# Patient Record
Sex: Male | Born: 1975 | Race: White | Hispanic: No | Marital: Married | Smoking: Never smoker
Health system: Southern US, Community
[De-identification: ages and names within clinical notes are randomized; demographics above are authoritative.]

## PROBLEM LIST (undated history)

## (undated) DIAGNOSIS — L564 Polymorphous light eruption: Secondary | ICD-10-CM

---

## 2019-01-14 ENCOUNTER — Ambulatory Visit
Admission: RE | Admit: 2019-01-14 | Discharge: 2019-01-14 | Disposition: A | Discharge status: Home / Self Care | Payer: BC Managed Care – PPO | Source: Ambulatory Visit | Attending: Family Medicine | Admitting: Family Medicine

## 2019-01-14 ENCOUNTER — Other Ambulatory Visit: Payer: Self-pay | Admitting: Family Medicine

## 2019-01-14 ENCOUNTER — Other Ambulatory Visit: Payer: Self-pay

## 2019-01-14 DIAGNOSIS — M79675 Pain in left toe(s): Secondary | ICD-10-CM | POA: Insufficient documentation

## 2019-03-16 ENCOUNTER — Other Ambulatory Visit: Payer: Self-pay

## 2019-03-16 DIAGNOSIS — Z20822 Contact with and (suspected) exposure to covid-19: Secondary | ICD-10-CM

## 2019-03-17 LAB — NOVEL CORONAVIRUS, NAA: SARS-CoV-2, NAA: NOT DETECTED

## 2020-07-01 IMAGING — CR LEFT FOOT - COMPLETE 3+ VIEW
1 series · 3 of 3 positions shown · non-contrast
Comparison: None.

CLINICAL DATA: Stubbed first toe 2 weeks ago with persistent pain
and swelling, initial encounter

EXAM:
LEFT FOOT - COMPLETE 3+ VIEW

[Series 1: dg foot complete left · 0.14mm/px · 3 of 3 slices shown]
[im 1/3]
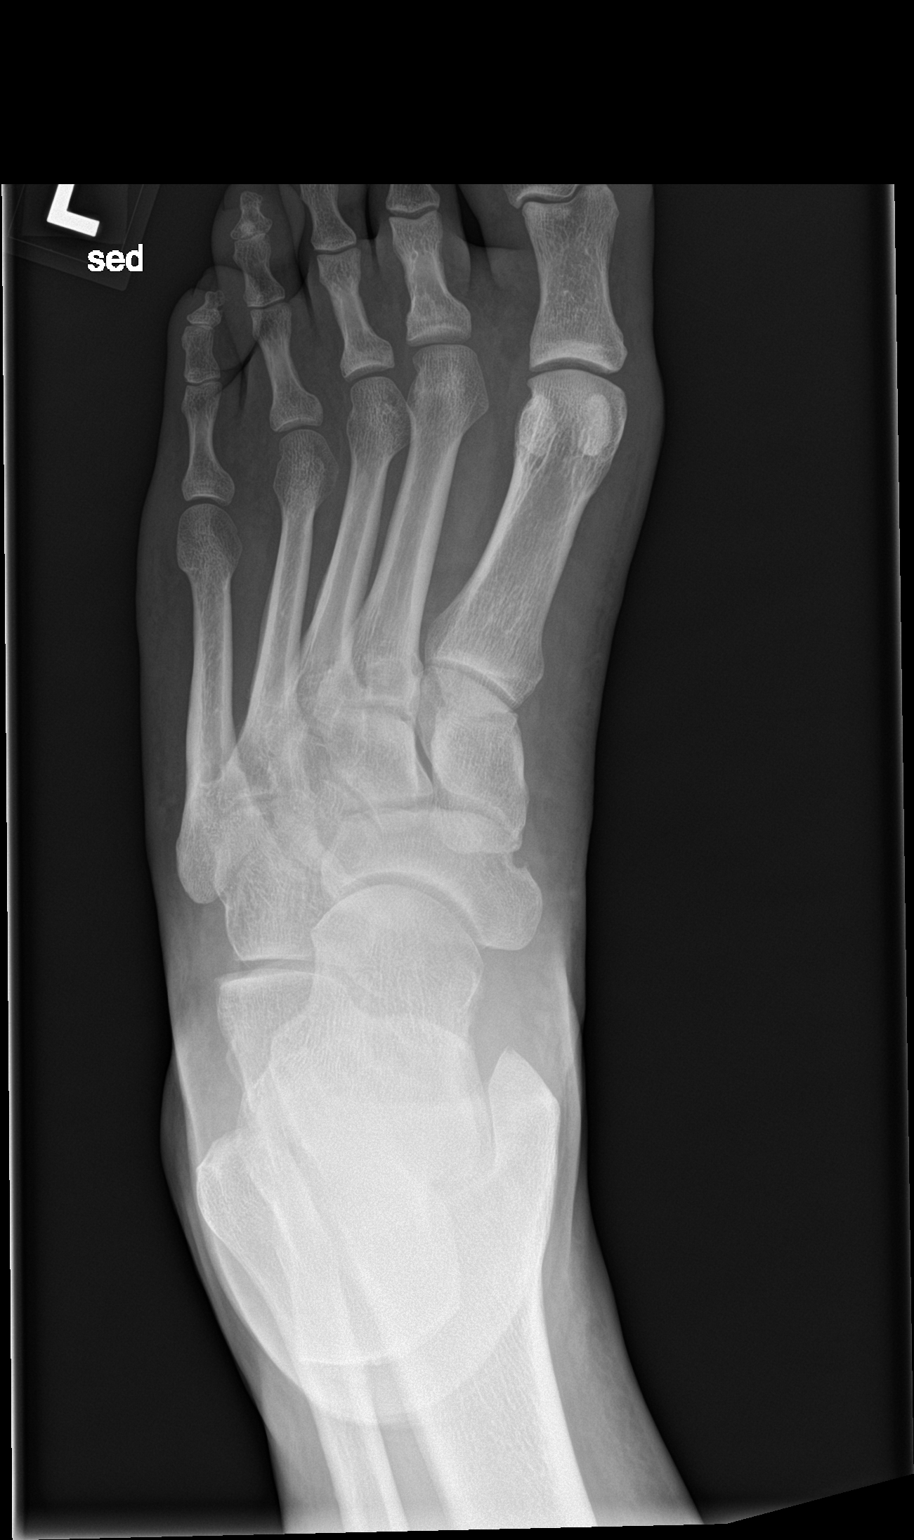
[im 2/3]
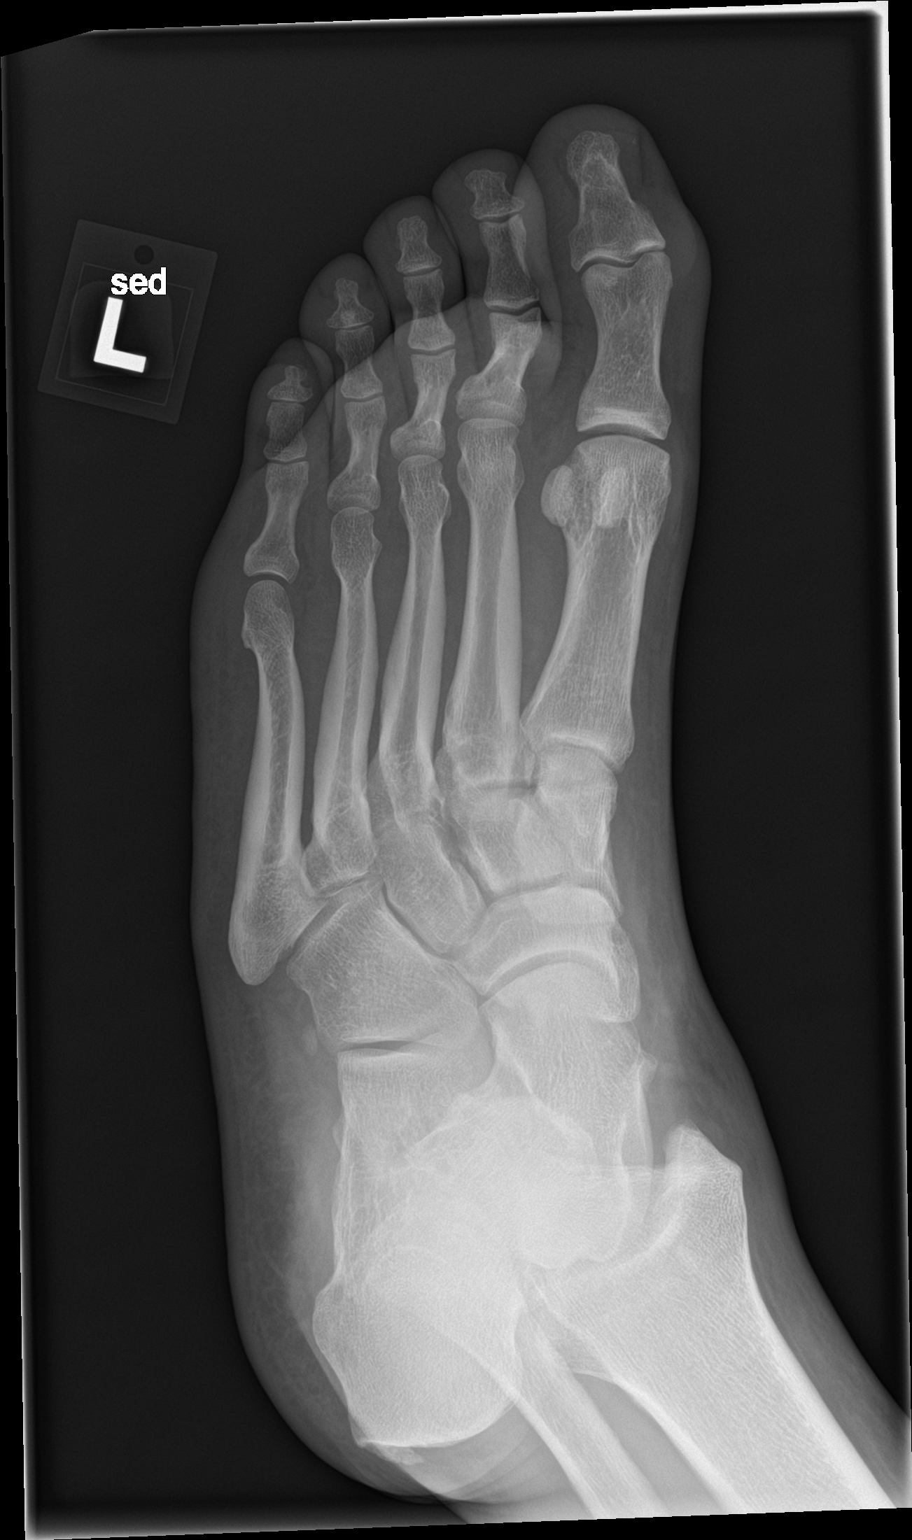
[im 3/3]
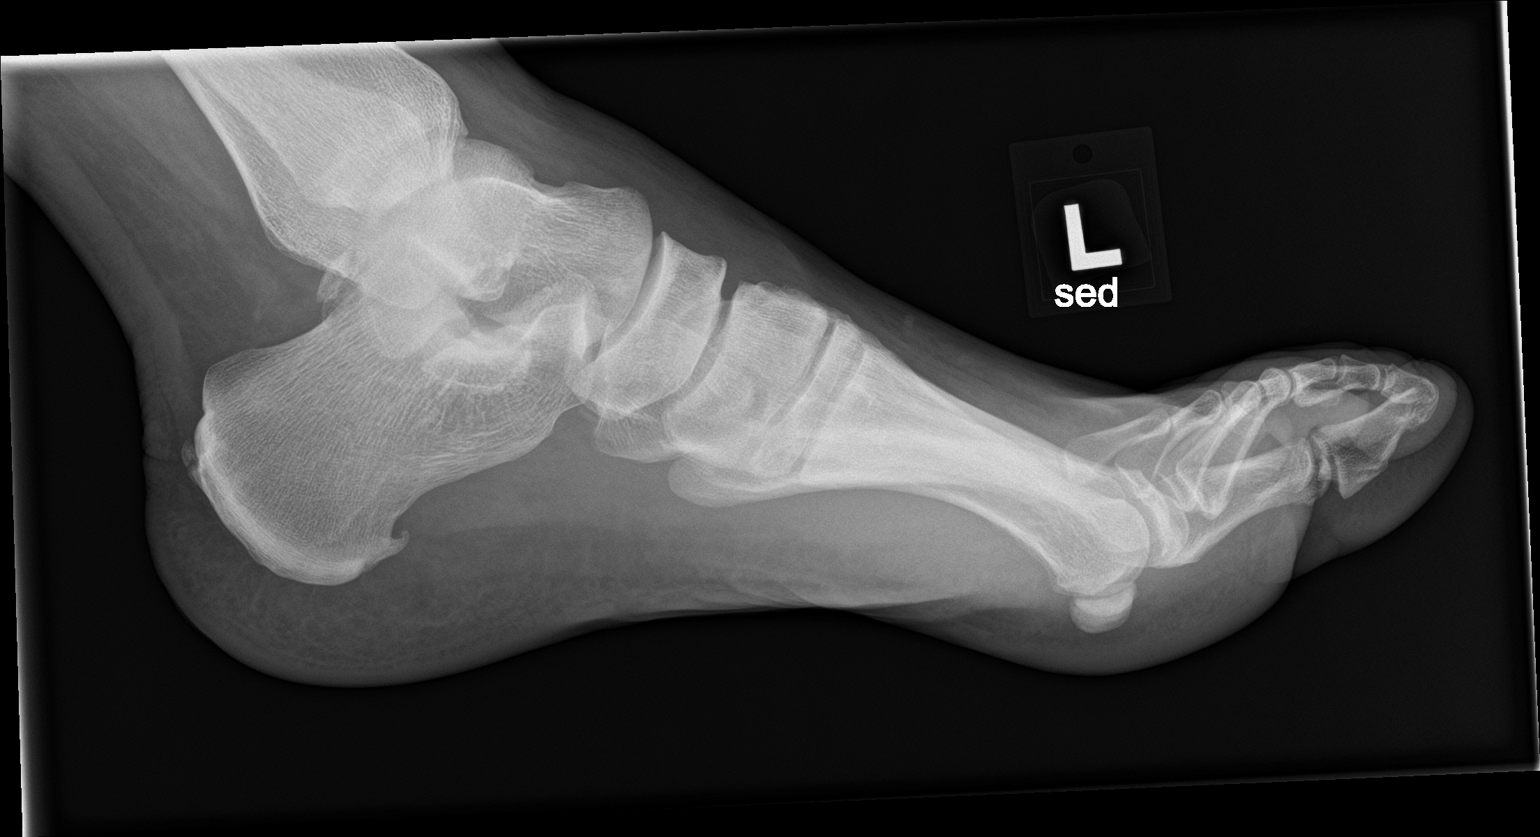

[3 of 3 positions shown; findings below may reference images not displayed]

FINDINGS: Calcaneal spurs are noted. No acute fracture or dislocation is seen.
No soft tissue abnormality is noted.
IMPRESSION: No acute abnormality seen.

## 2020-10-29 ENCOUNTER — Other Ambulatory Visit: Payer: Self-pay

## 2020-10-29 ENCOUNTER — Encounter: Payer: Self-pay | Admitting: Emergency Medicine

## 2020-10-29 ENCOUNTER — Emergency Department
Admission: EM | Admit: 2020-10-29 | Discharge: 2020-10-29 | Disposition: A | Discharge status: Home / Self Care | Payer: BC Managed Care – PPO | Attending: Emergency Medicine | Admitting: Emergency Medicine

## 2020-10-29 DIAGNOSIS — L509 Urticaria, unspecified: Secondary | ICD-10-CM | POA: Diagnosis not present

## 2020-10-29 DIAGNOSIS — T7840XA Allergy, unspecified, initial encounter: Secondary | ICD-10-CM | POA: Insufficient documentation

## 2020-10-29 HISTORY — DX: Polymorphous light eruption: L56.4

## 2020-10-29 MED ORDER — METHYLPREDNISOLONE SODIUM SUCC 125 MG IJ SOLR
125.0000 mg | Freq: Once | INTRAMUSCULAR | Status: AC
  Administered 2020-10-29: 125 mg via INTRAVENOUS
  Filled 2020-10-29: qty 2

## 2020-10-29 MED ORDER — PREDNISONE 50 MG PO TABS: 50.0000 mg | ORAL_TABLET | Freq: Every day | ORAL | 0 refills | Status: AC

## 2020-10-29 MED ORDER — FAMOTIDINE IN NACL 20-0.9 MG/50ML-% IV SOLN
20.0000 mg | Freq: Once | INTRAVENOUS | Status: AC
  Administered 2020-10-29: 20 mg via INTRAVENOUS
  Filled 2020-10-29: qty 50

## 2020-10-29 MED ORDER — DIPHENHYDRAMINE HCL 50 MG/ML IJ SOLN
25.0000 mg | INTRAMUSCULAR | Status: AC
  Administered 2020-10-29: 25 mg via INTRAVENOUS
  Filled 2020-10-29: qty 1

## 2020-10-29 NOTE — ED Notes (Signed)
Pt resting with eyes closed; easily aroused.  GCS 15.  RR even and unlabored on RA - no acute distress noted.  Pt reports itching and rash have improved since receiving meds after arrival.  Continues to deny airway compromise or other complaints.  Will continue to monitor for acute changes and maintain plan of care.

## 2020-10-29 NOTE — ED Notes (Signed)
ED Provider at bedside. 

## 2020-10-29 NOTE — ED Notes (Signed)
Rash appears faded back to appropriate skin color, pt reports itching mostly subsided

## 2020-10-29 NOTE — ED Notes (Signed)
Pt reports pruritus negligible

## 2020-10-29 NOTE — ED Triage Notes (Signed)
Pt with hives noted to abd, chest, arms. Pt is beginning to have rash noted to face. Pt with itching, no agioedema noted. No vomiting, shob or diarrhea. Pt took benadryl 50mg  po at 2200.

## 2020-10-29 NOTE — Discharge Instructions (Signed)
You have been seen in the Emergency Department (ED) today for an allergic reaction.  You have been stable throughout your stay in the Emergency Department.  Please take your medications as prescribed and follow up with your doctor as indicated.  You can continue to use Benadryl or a medication such as Zyrtec (cetirizine) as needed according to label instructions.  You might find the cetirizine is less likely to make you sleepy, and it only needs to be taken once a day or at most twice a day.  Please take the full 5-day course of prednisone as prescribed.  Return to the Emergency Department (ED) if you experience any worsening or new symptoms that concern you.

## 2020-10-29 NOTE — ED Notes (Signed)
Pt agreeable with d/c plan as discussed by provider Dr York Cerise.  This nurse has verbally reinforced d/c instructions and provided pt with written copy - pt acknowledges verbal understanding -denies any additional questions, concerns, needs

## 2020-10-29 NOTE — ED Provider Notes (Signed)
[facility] Emergency Department Provider Note  ____________________________________________   Event Date/Time   First MD Initiated Contact with Patient 10/29/20 0117     (approximate)  I have reviewed the triage vital signs and the nursing notes.   HISTORY  Chief Complaint Allergic Reaction    HPI Lance Pearson is a 45 y.o. male he denies any chronic medical issues other than "sun allergy" (polymorphous light eruption) who presents for evaluation of  about 24 hours of gradually worsening rash.  He said that it started on his arm and he assumes that he just came into contact with something that caused some hives.  It has gradually worsened over time and is now generalized over the majority of his body including his face and neck.  No oral involvement with no mouth lesions or tongue lesions.  No facial swelling, no difficulty breathing, no difficulty swallowing, no shortness of breath, no cough.  He also denies nausea, diarrhea, and abdominal pain.  However he was concerned because he took some Benadryl (50 mg by mouth) a few hours ago and the symptoms are still worsening.  He said that the rash is very itchy but not painful.  He has no recent new products, no new medications, and is unaware of any recent contact with any allergens that would cause this.  He has not spent more time outside in the sun than usual and in fact over the last couple of days he has been mostly inside.        Past Medical History:  Diagnosis Date  . Polymorphous light eruption     There are no problems to display for this patient.   History reviewed. No pertinent surgical history.  Prior to Admission medications   Medication Sig Start Date End Date Taking? Authorizing Provider  predniSONE (DELTASONE) 50 MG tablet Take 1 tablet (50 mg total) by mouth daily for 5 days. 10/29/20 11/03/20 Yes Loleta Rose, MD    Allergies Amoxicillin and Penicillins  History reviewed. No  pertinent family history.  Social History Social History   Tobacco Use  . Smoking status: Never Smoker  . Smokeless tobacco: Never Used    Review of Systems Constitutional: No fever/chills Eyes: No visual changes. ENT: No sore throat.  No difficulty swallowing. Cardiovascular: Denies chest pain. Respiratory: Denies shortness of breath. Gastrointestinal: No abdominal pain.  No nausea, no vomiting.  No diarrhea.  No constipation. Genitourinary: Negative for dysuria. Musculoskeletal: Negative for neck pain.  Negative for back pain. Integumentary: Generalized raised red and itching rash. Neurological: Negative for headaches, focal weakness or numbness.   ____________________________________________   PHYSICAL EXAM:  VITAL SIGNS: ED Triage Vitals [10/29/20 0110]  Enc Vitals Group     BP (!) 153/102     Pulse Rate 70     Resp 16     Temp 98 F (36.7 C)     Temp Source Oral     SpO2 100 %     Weight 122.5 kg (270 lb)     Height 1.854 m (6\' 1" )     Head Circumference      Peak Flow      Pain Score 0     Pain Loc      Pain Edu?      Excl. in GC?     Constitutional: Alert and oriented.  Eyes: Conjunctivae are normal.  Head: Atraumatic. Nose: No congestion/rhinnorhea. Mouth/Throat: Patient is wearing a mask.  No oral lesions on exam.  No  angioedema. Neck: No stridor.  No meningeal signs.   Cardiovascular: Normal rate, regular rhythm. Good peripheral circulation. Respiratory: Normal respiratory effort.  No retractions. Gastrointestinal: Soft and nontender. No distention.  Musculoskeletal: No lower extremity tenderness nor edema. No gross deformities of extremities. Neurologic:  Normal speech and language. No gross focal neurologic deficits are appreciated.  Skin:  Skin is warm, dry and intact.  He has a generalized urticarial rash over most of the skin surfaces including his face, and is worse on the arms but also present on his torso.  No involvement of the palms or  soles. Psychiatric: Mood and affect are normal. Speech and behavior are normal.  ____________________________________________   LABS (all labs ordered are listed, but only abnormal results are displayed)  Labs Reviewed - No data to display ____________________________________________   PROCEDURES   Procedure(s) performed (including Critical Care):  Procedures   ____________________________________________   INITIAL IMPRESSION / MDM / ASSESSMENT AND PLAN / ED COURSE  As part of my medical decision making, I reviewed the following data within the electronic MEDICAL RECORD NUMBER Nursing notes reviewed and incorporated, Old chart reviewed and Notes from prior ED visits  Differential diagnosis includes, but is not limited to, allergic reaction, anaphylaxis, medication or drug side effect.  Patient does not meet criteria for anaphylaxis and is in no distress.  However his urticaria has been worsening over time including after using Benadryl.  An IV has been placed and I am giving him Solu-Medrol 125 mg IV, famotidine 20 mg IV, and Benadryl 25 mg IV.  Will observe for a few hours to make sure he is not getting worse or developing clinical signs of anaphylaxis.  Patient understands and agrees with the plan.     Clinical Course as of 10/29/20 0502  Mon Oct 29, 2020  0244 Rash and itching are improving. [CF]  [phone] The patient feels much better and the hives have almost completely resolved.  He is comfortable with the plan for discharge.  Prescriptions as listed below.  I gave my usual and customary return precautions. [CF]    Clinical Course User Index [CF] Loleta Rose, MD     ____________________________________________  FINAL CLINICAL IMPRESSION(S) / ED DIAGNOSES  Final diagnoses:  Allergic reaction, initial encounter  Hives     MEDICATIONS GIVEN DURING THIS VISIT:  Medications  methylPREDNISolone sodium succinate (SOLU-MEDROL) 125 mg/2 mL injection 125 mg (125 mg  Intravenous Given 10/29/20 0140)  famotidine (PEPCID) IVPB 20 mg premix (0 mg Intravenous Stopped 10/29/20 0212)  diphenhydrAMINE (BENADRYL) injection 25 mg (25 mg Intravenous Given 10/29/20 0138)     ED Discharge Orders         Ordered    predniSONE (DELTASONE) 50 MG tablet  Daily        10/29/20 0500          *Please note:  Lance Pearson was evaluated in Emergency Department on 10/29/2020 for the symptoms described in the history of present illness. He was evaluated in the context of the global COVID-19 pandemic, which necessitated consideration that the patient might be at risk for infection with the SARS-CoV-2 virus that causes COVID-19. Institutional protocols and algorithms that pertain to the evaluation of patients at risk for COVID-19 are in a state of rapid change based on information released by regulatory bodies including the CDC and federal and state organizations. These policies and algorithms were followed during the patient's care in the ED.  Some ED evaluations and interventions may be delayed as  a result of limited staffing during and after the pandemic.*  Note:  This document was prepared using Dragon voice recognition software and may include unintentional dictation errors.   Loleta Rose, MD 10/29/20 [phone]

## 2020-10-29 NOTE — ED Notes (Addendum)
ED Provider at bedside.  Pt reports making potato soup for family at noon and approx 1600 itchy welts appeared on arms, legs and trunk, pt took PO benadryl 50 mg approx 1630, pt reports working outside on regular basis ([employer]) and only known allergy to PCN and reports amoxicillin "closed my throat"   Pt appears with raised red rash, itching, all over body especially on extremities of limbs, breathing without distress

## 2020-10-30 ENCOUNTER — Emergency Department
Admission: EM | Admit: 2020-10-30 | Discharge: 2020-10-30 | Disposition: A | Discharge status: Home / Self Care | Payer: BC Managed Care – PPO | Attending: Emergency Medicine | Admitting: Emergency Medicine

## 2020-10-30 DIAGNOSIS — L509 Urticaria, unspecified: Secondary | ICD-10-CM | POA: Insufficient documentation

## 2020-10-30 DIAGNOSIS — T782XXA Anaphylactic shock, unspecified, initial encounter: Secondary | ICD-10-CM

## 2020-10-30 DIAGNOSIS — T783XXA Angioneurotic edema, initial encounter: Secondary | ICD-10-CM

## 2020-10-30 MED ORDER — EPINEPHRINE 0.3 MG/0.3ML IJ SOAJ: 0.3000 mg | INTRAMUSCULAR | 1 refills | Status: AC | PRN

## 2020-10-30 NOTE — ED Notes (Signed)
Patient reports mild hives starting to appear to bilateral arms, MD aware. No new orders at this time.

## 2020-10-30 NOTE — ED Triage Notes (Signed)
Patient BIBA from home. Patient reports increasing hives and hoarseness at 1230 today. Patient reports he has had hives since Sunday at 4pm. Unknown allergent. Patient report he was in ER Sunday evening. Patient has been taking benadryl 4-6 hours at home. Seen at [facility] this morning. Patient given epi 0.3 IM by fire rescue and then 0.2 epi IM by EMS in route. Patient given 50mg  IV benadryl and 20 mg pepcid IV. Patient A&OX3. Patient voice noted to be hoarse.

## 2020-10-30 NOTE — ED Notes (Signed)
ED Provider at bedside. 

## 2020-10-30 NOTE — ED Provider Notes (Signed)
[facility] Emergency Department Provider Note   ____________________________________________   Event Date/Time   First MD Initiated Contact with Patient 10/30/20 1503     (approximate)  I have reviewed the triage vital signs and the nursing notes.   HISTORY  Chief Complaint Allergic Reaction    HPI Lance Pearson is a 45 y.o. 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  Patient states that he was seen approximately 3 days ago for hives and generalized itchiness.  Patient was treated at that time with Benadryl and steroids with improvement of his symptoms.  After that, patient states that he went to bed, woke up and had a recurrence of his symptoms at that time as well with hives and increased itching.  Patient states that he has been attempting to control the symptoms at home with Benadryl every 4-6 hours.  However, this morning when he woke up he began having hoarseness and the sensation of throat swelling.  Patient called EMS and was initially given 0.3 mg of epinephrine IM by fire rescue and an additional 0.2 mg of epinephrine IM by EMS upon their arrival.  EMS also gave patient 50 mg of IV Benadryl and 20 mg of IV Pepcid prior to his arrival.  Patient states that his sensation of throat swelling and hives have almost completely resolved at this time.  Patient denies ever having any palpitations, shortness of breath, nausea/vomiting, or abdominal pain         Past Medical History:  Diagnosis Date  . Polymorphous light eruption     There are no problems to display for this patient.   No past surgical history on file.  Prior to Admission medications   Medication Sig Start Date End Date Taking? Authorizing Provider  predniSONE (DELTASONE) 50 MG tablet Take 1 tablet (50 mg total) by mouth daily for 5 days. 10/29/20 11/03/20  Loleta Rose, MD     Allergies Amoxicillin and Penicillins  No family history on file.  Social History Social History   Tobacco Use  . Smoking status: Never Smoker  . Smokeless tobacco: Never Used    Review of Systems Constitutional: No fever/chills Eyes: No visual changes. ENT: Endorses throat swelling prior to epinephrine administration Cardiovascular: Denies chest pain. Respiratory: Denies shortness of breath. Gastrointestinal: No abdominal pain.  No nausea, no vomiting.  No diarrhea. Genitourinary: Negative for dysuria. Musculoskeletal: Negative for acute arthralgias Skin: Positive for rash. Neurological: Negative for headaches, weakness/numbness/paresthesias in any extremity Psychiatric: Negative for suicidal ideation/homicidal ideation   ____________________________________________   PHYSICAL EXAM:  VITAL SIGNS: ED Triage Vitals  Enc Vitals Group     BP 10/30/20 1504 (!) 129/110     Pulse Rate 10/30/20 1504 (!) 107     Resp 10/30/20 1504 18     Temp 10/30/20 1504 98 F (36.7 C)     Temp Source 10/30/20 1504 Oral     SpO2 10/30/20 1504 97 %     Weight 10/30/20 1505 268 lb 15.4 oz (122 kg)     Height 10/30/20 1505 6\' 1"  (1.854 m)     Head Circumference --      Peak Flow --      Pain Score 10/30/20 1504 0     Pain Loc --      Pain Edu? --      Excl. in GC? --    Constitutional: Alert and oriented. Well  appearing and in no acute distress. Eyes: Conjunctivae are normal. PERRL. Head: Atraumatic. Nose: No congestion/rhinnorhea. Mouth/Throat: Mucous membranes are moist.  Airway patent without any stridor Neck: No stridor Cardiovascular: Grossly normal heart sounds.  Good peripheral circulation. Respiratory: Normal respiratory effort.  No retractions. Gastrointestinal: Soft and nontender. No distention. Musculoskeletal: No obvious deformities Neurologic:  Normal speech and language. No gross focal neurologic deficits are appreciated. Skin:  Skin is warm and dry.  Mild  urticaria over trunk and extremities Psychiatric: Mood and affect are normal. Speech and behavior are normal.  ____________________________________________   LABS (all labs ordered are listed, but only abnormal results are displayed)  Labs Reviewed - No data to display   PROCEDURES  Procedure(s) performed (including Critical Care):  .1-3 Lead EKG Interpretation Performed by: Merwyn Katos, MD Authorized by: Merwyn Katos, MD     Interpretation: normal     ECG rate:  87   ECG rate assessment: normal     Rhythm: sinus rhythm     Ectopy: none     Conduction: normal       ____________________________________________   INITIAL IMPRESSION / ASSESSMENT AND PLAN / ED COURSE  As part of my medical decision making, I reviewed the following data within the electronic MEDICAL RECORD NUMBER Nursing notes reviewed and incorporated, Old chart reviewed, and Notes from prior ED visits reviewed and incorporated        + Cutaneous hives/erythema No evidence of multiorgan involvement  Given history and exam, presentation most consistent with allergic reaction. I have low suspicion for toxic shock syndrome, asthma exacerbation, or drug toxicity.  Patient did receive epinephrine in the field and therefore required a 4-hour observation  Reassessment: 2001 patient resting comfortably with continued decrease in urticaria and no hoarseness, stridor, or throat swelling.  The patient has been reexamined and is ready to be discharged.  All diagnostic results have been reviewed and discussed with the patient/family.  Care plan has been outlined and the patient/family understands all current diagnoses, results, and treatment plans.  There are no new complaints, changes, or physical findings at this time.  All questions have been addressed and answered.  All medications, if any, that were given while in the emergency department or any that are being prescribed have been reviewed with the patient/family.   All side effects and adverse reactions have been explained.  Patient was instructed to, and agrees to follow-up with their primary care physician as well as return to the emergency department if any new or worsening symptoms develop.  Rx: Prednisone 60mg  qday x3days, Benadryl 25mg  q8hr x3days.  As needed EpiPen Disposition: Discharge home with SRP. Follow up with PCP in 1-2 days.      ____________________________________________   FINAL CLINICAL IMPRESSION(S) / ED DIAGNOSES  Final diagnoses:  None     ED Discharge Orders    None       Note:  This document was prepared using Dragon voice recognition software and may include unintentional dictation errors.   , MD 10/30/20 2013

## 2022-12-12 ENCOUNTER — Ambulatory Visit: Payer: BC Managed Care – PPO | Admitting: Podiatry

## 2022-12-12 ENCOUNTER — Encounter: Payer: Self-pay | Admitting: Podiatry

## 2022-12-12 DIAGNOSIS — Q666 Other congenital valgus deformities of feet: Secondary | ICD-10-CM | POA: Diagnosis not present

## 2022-12-12 NOTE — Progress Notes (Signed)
  Subjective:  Patient ID: Lance Pearson, male    DOB: [birth year],  MRN: [id number]  Chief Complaint  Patient presents with   Foot Pain    47 y.o. male presents with the above complaint.  Patient presents with pes planovalgus deformity.  Patient states that he is having some foot pain due to the arches and heel.  He does do a lot on his foot.  He wanted to get it evaluated he denies seeing anyone as prior to seeing me denies any other acute complaints he does not wear any orthotics he was okay shoes.   Review of Systems: Negative except as noted in the HPI. Denies N/V/F/Ch.  Past Medical History:  Diagnosis Date   Polymorphous light eruption     Current Outpatient Medications:    colchicine 0.6 MG tablet, Take 1 tablet by mouth 2 (two) times daily., Disp: , Rfl:    levocetirizine (XYZAL) 5 MG tablet, 1 tablet in the evening Orally Once a day for 30 day(s), Disp: , Rfl:    diclofenac (CATAFLAM) 50 MG tablet, , Disp: , Rfl:    EPINEPHrine 0.3 mg/0.3 mL IJ SOAJ injection, Inject 0.3 mg into the muscle as needed for anaphylaxis., Disp: 3 each, Rfl: 1   rosuvastatin (CRESTOR) 5 MG tablet, TAKE 1 TABLET BY MOUTH EVERY DAY for 90 days, Disp: , Rfl:   Social History   Tobacco Use  Smoking Status Never  Smokeless Tobacco Never    Allergies  Allergen Reactions   Amoxicillin Swelling   Penicillins    Objective:  There were no vitals filed for this visit. There is no height or weight on file to calculate BMI. Constitutional Well developed. Well nourished.  Vascular Dorsalis pedis pulses palpable bilaterally. Posterior tibial pulses palpable bilaterally. Capillary refill normal to all digits.  No cyanosis or clubbing noted. Pedal hair growth normal.  Neurologic Normal speech. Oriented to person, place, and time. Epicritic sensation to light touch grossly present bilaterally.  Dermatologic Nails well groomed and normal in appearance. No open wounds. No skin lesions.  Orthopedic:  Gait examination shows pes planovalgus deformity calcaneovalgus to many toe signs partially recruit the arch with dorsiflexion of the hallux unable to perform single and double heel raise   Radiographs: None Assessment:   1. Pes planovalgus    Plan:  Patient was evaluated and treated and all questions answered.  Bilateral pes planovalgus -Pes planovalgus -I explained to patient the etiology of pes planovalgus and relationship with Planter fasciitis and various treatment options were discussed.  Given patient foot structure in the setting of Planter fasciitis I believe patient will benefit from custom-made orthotics to help control the hindfoot motion support the arch of the foot and take the stress away from plantar fascial.  Patient agrees with the plan like to proceed with orthotics -Patient was casted for orthotics   No follow-ups on file.

## 2023-01-16 ENCOUNTER — Telehealth: Payer: Self-pay | Admitting: Podiatry

## 2023-01-16 NOTE — Telephone Encounter (Signed)
Pt checking to see if his orthotics are ready for pick up.

## 2023-02-11 ENCOUNTER — Ambulatory Visit (INDEPENDENT_AMBULATORY_CARE_PROVIDER_SITE_OTHER): Payer: BC Managed Care – PPO | Admitting: Podiatry

## 2023-02-11 DIAGNOSIS — Q666 Other congenital valgus deformities of feet: Secondary | ICD-10-CM

## 2023-02-11 NOTE — Progress Notes (Unsigned)
Patient presents today to pick up 1 pair of custom orthotics.    Patient was dispensed 1 pair of custom orthotics  Fit was satisfactory. Instructions for break-in and wear was reviewed and a copy was given to the patient.

## 2023-02-11 NOTE — Telephone Encounter (Signed)
Spoke with pt yesterday to confirm he has an appt today and that orthotics are in.

## 2024-08-17 ENCOUNTER — Other Ambulatory Visit: Payer: Self-pay

## 2024-08-17 ENCOUNTER — Telehealth: Payer: Self-pay

## 2024-08-17 DIAGNOSIS — Z1211 Encounter for screening for malignant neoplasm of colon: Secondary | ICD-10-CM

## 2024-08-17 NOTE — Telephone Encounter (Signed)
.  Gastroenterology Pre-Procedure Review  Request Date: 11/28/24 Requesting Physician: Dr. jinny  PATIENT REVIEW QUESTIONS: The patient responded to the following health history questions as indicated:    1. Are you having any GI issues? no 2. Do you have a personal history of Polyps? no 3. Do you have a family history of Colon Cancer or Polyps? no 4. Diabetes Mellitus? no 5. Joint replacements in the past 12 months?no 6. Major health problems in the past 3 months?no 7. Any artificial heart valves, MVP, or defibrillator?no    MEDICATIONS & ALLERGIES:    Patient reports the following regarding taking any anticoagulation/antiplatelet therapy:   Plavix, Coumadin, Eliquis, Xarelto, Lovenox, Pradaxa, Brilinta, or Effient? no Aspirin? no  Patient confirms/reports the following medications:  Current Outpatient Medications  Medication Sig Dispense Refill   colchicine 0.6 MG tablet Take 1 tablet by mouth 2 (two) times daily.     diclofenac (CATAFLAM) 50 MG tablet      EPINEPHrine  0.3 mg/0.3 mL IJ SOAJ injection Inject 0.3 mg into the muscle as needed for anaphylaxis. 3 each 1   levocetirizine (XYZAL) 5 MG tablet 1 tablet in the evening Orally Once a day for 30 day(s)     rosuvastatin (CRESTOR) 5 MG tablet TAKE 1 TABLET BY MOUTH EVERY DAY for 90 days     No current facility-administered medications for this visit.    Patient confirms/reports the following allergies:  Allergies[1]  No orders of the defined types were placed in this encounter.   AUTHORIZATION INFORMATION Primary Insurance: [facility] Group #: [id number]  Secondary Insurance: 1D#: Group #:  SCHEDULE INFORMATION: Date: 11/28/24 Time: Location: [facility]    [1]  Allergies Allergen Reactions   Amoxicillin Swelling   Penicillins

## 2024-11-28 ENCOUNTER — Ambulatory Visit: Admit: 2024-11-28 | Admitting: Gastroenterology

## 2024-11-28 SURGERY — COLONOSCOPY
Anesthesia: General
